# Patient Record
Sex: Female | Born: 1995 | Race: White | Hispanic: No | Marital: Single | Smoking: Never smoker
Health system: Southern US, Community
[De-identification: ages and names within clinical notes are randomized; demographics above are authoritative.]

## PROBLEM LIST (undated history)

## (undated) HISTORY — PX: EYE SURGERY: SHX253

---
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
## 2017-07-12 ENCOUNTER — Encounter (HOSPITAL_COMMUNITY): Payer: Self-pay | Admitting: Family Medicine

## 2017-07-12 ENCOUNTER — Ambulatory Visit (HOSPITAL_COMMUNITY)
Admission: EM | Admit: 2017-07-12 | Discharge: 2017-07-12 | Disposition: A | Discharge status: Home / Self Care | Payer: BLUE CROSS/BLUE SHIELD | Attending: Family Medicine | Admitting: Family Medicine

## 2017-07-12 DIAGNOSIS — R1013 Epigastric pain: Secondary | ICD-10-CM | POA: Diagnosis not present

## 2017-07-12 NOTE — ED Triage Notes (Signed)
Pt here for epigastric pain that has been going on for a month. Reports she believed she was pregnancy but she had a negative pregnancy test. sts she missed a period. Reports nausea and fatigue with eating,.

## 2017-07-12 NOTE — Discharge Instructions (Signed)
You may take Zantac 150mg  twice daily.

## 2017-07-12 NOTE — ED Notes (Signed)
Patient discharged by provider Brian Hagler, MD  

## 2017-07-13 NOTE — ED Provider Notes (Signed)
  [facility]   [id number] 07/12/17 Arrival Time: 1805  ASSESSMENT & PLAN:  1. Dyspepsia    Will begin OTC Zantac 150mg  BID. Smaller and more frequent meals. Will f/u with PCP or here if needed. Reviewed expectations re: course of current medical issues. Questions answered. Outlined signs and symptoms indicating need for more acute intervention. Patient verbalized understanding. After Visit Summary given.   SUBJECTIVE:  Chelsea Green is a 21 y.o. female who presents with complaint of abdominal discomfort. Onset gradual, 1 month ago. Off and on. Worse after meals. "Burning sensation" described in epigastric area without radiation. Occasional nausea without emesis. Normal PO intake. No urinary symptoms. No flank pain.   OTC treatment: None.  Patient's last menstrual period was 07/05/2017.   Past Surgical History:  Procedure Laterality Date  . EYE SURGERY      ROS: As per HPI.  OBJECTIVE: General appearance: alert; no distress Lungs: clear to auscultation bilaterally Heart: regular rate and rhythm Abdomen: soft; non-distended; no tenderness; bowel sounds present; no masses or organomegaly; no guarding or rebound tenderness Back: no CVA tenderness Extremities: no edema; symmetrical with no gross deformities Skin: warm and dry Neurologic: normal gait Psychological: alert and cooperative; normal mood and affect  No Known Allergies                                              Social History   Social History  . Marital status: Single    Spouse name: N/A  . Number of children: N/A  . Years of education: N/A   Occupational History  . Not on file.   Social History Main Topics  . Smoking status: Never Smoker  . Smokeless tobacco: Not on file  . Alcohol use Not on file  . Drug use: Unknown  . Sexual activity: Not on file   Other Topics Concern  . Not on file   Social History Narrative  . No narrative on file      Mardella LaymanHagler, Jeray Shugart, MD 07/13/17 1015

## 2018-06-07 ENCOUNTER — Other Ambulatory Visit: Payer: Self-pay

## 2018-06-07 ENCOUNTER — Emergency Department (HOSPITAL_COMMUNITY)
Admission: EM | Admit: 2018-06-07 | Discharge: 2018-06-08 | Disposition: A | Discharge status: Home / Self Care | Payer: BLUE CROSS/BLUE SHIELD | Attending: Emergency Medicine | Admitting: Emergency Medicine

## 2018-06-07 ENCOUNTER — Emergency Department (HOSPITAL_COMMUNITY): Payer: BLUE CROSS/BLUE SHIELD

## 2018-06-07 ENCOUNTER — Encounter (HOSPITAL_COMMUNITY): Payer: Self-pay | Admitting: Emergency Medicine

## 2018-06-07 DIAGNOSIS — Y998 Other external cause status: Secondary | ICD-10-CM | POA: Diagnosis not present

## 2018-06-07 DIAGNOSIS — Y92009 Unspecified place in unspecified non-institutional (private) residence as the place of occurrence of the external cause: Secondary | ICD-10-CM | POA: Insufficient documentation

## 2018-06-07 DIAGNOSIS — Y9389 Activity, other specified: Secondary | ICD-10-CM | POA: Insufficient documentation

## 2018-06-07 DIAGNOSIS — W01198A Fall on same level from slipping, tripping and stumbling with subsequent striking against other object, initial encounter: Secondary | ICD-10-CM | POA: Insufficient documentation

## 2018-06-07 DIAGNOSIS — S7002XA Contusion of left hip, initial encounter: Secondary | ICD-10-CM | POA: Diagnosis not present

## 2018-06-07 DIAGNOSIS — W010XXA Fall on same level from slipping, tripping and stumbling without subsequent striking against object, initial encounter: Secondary | ICD-10-CM

## 2018-06-07 DIAGNOSIS — S79912A Unspecified injury of left hip, initial encounter: Secondary | ICD-10-CM | POA: Diagnosis present

## 2018-06-07 LAB — I-STAT BETA HCG BLOOD, ED (MC, WL, AP ONLY)

## 2018-06-07 MED ORDER — OXYCODONE-ACETAMINOPHEN 5-325 MG PO TABS
1.0000 | ORAL_TABLET | ORAL | Status: DC | PRN
  Administered 2018-06-07: 1 via ORAL
  Filled 2018-06-07: qty 1

## 2018-06-07 NOTE — ED Triage Notes (Signed)
Pt reports she slipped on a rug and landed on her L hip. Pt reports she has had issues with her L hip being dislocated in the past. Pt reports the pain radiates to her back. Pt reports the pain worsens with movement.

## 2018-06-08 MED ORDER — TRAMADOL HCL 50 MG PO TABS
50.0000 mg | ORAL_TABLET | Freq: Once | ORAL | Status: AC
  Administered 2018-06-08: 50 mg via ORAL
  Filled 2018-06-08: qty 1

## 2018-06-08 MED ORDER — TRAMADOL HCL 50 MG PO TABS: 50.0000 mg | ORAL_TABLET | Freq: Four times a day (QID) | ORAL | 0 refills | Status: AC | PRN

## 2018-06-08 NOTE — ED Provider Notes (Signed)
[facility] EMERGENCY DEPARTMENT Provider Note   CSN: [id number] Arrival date & time: 06/07/18  2130     History   Chief Complaint Chief Complaint  Patient presents with  . Hip Pain    HPI Chelsea Green is a 22 y.o. female.  The history is provided by the patient.  She had a slip and fall at home landing on her left hip.  She is complaining of pain in that hip.  Pain is rated at 9/10.  She has been having pain in that hip for quite some time.  She relates that she was seen at a clinic where x-rays showed that she had probably dislocated her hip and it spontaneously popped back in place.  She has been taking naproxen at home for this ongoing pain.  Tonight, after the fall, she was able to put some weight on the left leg, but it was quite painful.  She denies other injury.  History reviewed. No pertinent past medical history.  There are no active problems to display for this patient.   Past Surgical History:  Procedure Laterality Date  . EYE SURGERY       OB History   None      Home Medications    Prior to Admission medications   Not on File    Family History No family history on file.  Social History Social History   Tobacco Use  . Smoking status: Never Smoker  . Smokeless tobacco: Never Used  Substance Use Topics  . Alcohol use: Never    Frequency: Never  . Drug use: Not Currently     Allergies   Patient has no known allergies.   Review of Systems Review of Systems  All other systems reviewed and are negative.    Physical Exam Updated Vital Signs BP 109/70 (BP Location: Right Arm)   Pulse 87   Temp 98 F (36.7 C) (Oral)   Resp 14   LMP 05/28/2018   SpO2 100%   Physical Exam  Nursing note and vitals reviewed.  22 year old female, resting comfortably and in no acute distress. Vital signs are normal. Oxygen saturation is 100%, which is normal. Head is normocephalic and atraumatic. PERRLA, EOMI. Oropharynx is  clear. Neck is nontender and supple without adenopathy or JVD. Back is nontender and there is no CVA tenderness. Lungs are clear without rales, wheezes, or rhonchi. Chest is nontender. Heart has regular rate and rhythm without murmur. Abdomen is soft, flat, nontender without masses or hepatosplenomegaly and peristalsis is normoactive. Extremities: Moderate tenderness in the left hip extending into the left pubic ramus and left lower lumbar area.  No deformities present.  Full passive range of motion is present, but there is pain with any movement.  Remainder of extremity exam is normal. Skin is warm and dry without rash. Neurologic: Mental status is normal, cranial nerves are intact, there are no motor or sensory deficits.  ED Treatments / Results  Labs (all labs ordered are listed, but only abnormal results are displayed) Labs Reviewed  I-STAT BETA HCG BLOOD, ED ([facility], [facility], [facility] ONLY)   Radiology Dg Hip Unilat With Pelvis 2-3 Views Left  Result Date: 06/07/2018 CLINICAL DATA:  Patient slipped on rugal landing on left hip.  Pain. EXAM: DG HIP (WITH OR WITHOUT PELVIS) 2-3V LEFT COMPARISON:  None. FINDINGS: There is no evidence of hip fracture or dislocation. No pelvic diastasis. The pubic rami appear intact. There is no evidence of arthropathy or other  focal bone abnormality. Soft tissues are unremarkable. IMPRESSION: No fracture or malalignment identified. Electronically Signed   By: Tollie Ethavid  Kwon M.D.   On: 06/07/2018 22:56    Procedures Procedures   Medications Ordered in ED Medications  oxyCODONE-acetaminophen (PERCOCET/ROXICET) 5-325 MG per tablet 1 tablet (1 tablet Oral Given 06/07/18 2201)  traMADol (ULTRAM) tablet 50 mg (has no administration in time range)     Initial Impression / Assessment and Plan / ED Course  I have reviewed the triage vital signs and the nursing notes.  Pertinent labs & imaging results that were available during my care of the patient were reviewed by me and  considered in my medical decision making (see chart for details).  Fall with injury to the left hip.  X-rays are negative for fracture.  I am curious about her history of spontaneous dislocation and reduction of left hip.  This does not seem likely.  I do wonder if her chronic hip pain may actually be sciatica.  She is discharged with instructions on ice and elevation.  Given crutches to use as needed.  Told to use over-the-counter analgesics as needed for pain, given prescription for tramadol for more severe pain.  Referred to orthopedics for further outpatient work-up.  Final Clinical Impressions(s) / ED Diagnoses   Final diagnoses:  Fall from slipping, initial encounter  Contusion of left hip, initial encounter    ED Discharge Orders         Ordered    traMADol (ULTRAM) 50 MG tablet  Every 6 hours PRN     06/08/18 0122           Dione BoozeGlick, Jezelle Gullick, MD 06/08/18 [phone]

## 2018-06-08 NOTE — Discharge Instructions (Addendum)
Apply ice for 30 minutes 3-4 times a day.  Use crutches as needed.  Take naproxen (Aleve) as needed for pain.  You may take acetaminophen for additional pain relief.  Reserve tramadol for pain not relieved with naproxen plus acetaminophen.  Use crutches as needed.  Follow-up with the orthopedic specialist.

## 2019-04-24 ENCOUNTER — Other Ambulatory Visit: Payer: Self-pay

## 2019-04-24 ENCOUNTER — Encounter (HOSPITAL_COMMUNITY): Payer: Self-pay | Admitting: Emergency Medicine

## 2019-04-24 ENCOUNTER — Ambulatory Visit (HOSPITAL_COMMUNITY)
Admission: EM | Admit: 2019-04-24 | Discharge: 2019-04-24 | Disposition: A | Discharge status: Home / Self Care | Payer: BC Managed Care – PPO | Attending: Emergency Medicine | Admitting: Emergency Medicine

## 2019-04-24 DIAGNOSIS — R6889 Other general symptoms and signs: Secondary | ICD-10-CM

## 2019-04-24 DIAGNOSIS — R059 Cough, unspecified: Secondary | ICD-10-CM

## 2019-04-24 DIAGNOSIS — R05 Cough: Secondary | ICD-10-CM | POA: Diagnosis not present

## 2019-04-24 DIAGNOSIS — Z20828 Contact with and (suspected) exposure to other viral communicable diseases: Secondary | ICD-10-CM | POA: Diagnosis not present

## 2019-04-24 DIAGNOSIS — Z20822 Contact with and (suspected) exposure to covid-19: Secondary | ICD-10-CM

## 2019-04-24 DIAGNOSIS — R300 Dysuria: Secondary | ICD-10-CM | POA: Insufficient documentation

## 2019-04-24 LAB — POCT URINALYSIS DIP (DEVICE)
Bilirubin Urine: NEGATIVE
Glucose, UA: NEGATIVE mg/dL
Hgb urine dipstick: NEGATIVE
Ketones, ur: NEGATIVE mg/dL
Leukocytes,Ua: NEGATIVE
Nitrite: NEGATIVE
Protein, ur: NEGATIVE mg/dL
Specific Gravity, Urine: >=1.03 (ref 1.005–1.030)
Urobilinogen, UA: 0.2 mg/dL (ref 0.0–1.0)
pH: 5.5 (ref 5.0–8.0)

## 2019-04-24 NOTE — Discharge Instructions (Signed)
Your urine did not show signs of infection today.    Your COVID test is pending.  You should self quarantine until your test result is back and is negative.    Go to the emergency department if you develop shortness of breath, high fever, severe diarrhea, or other concerning symptoms.

## 2019-04-24 NOTE — ED Triage Notes (Signed)
Pt here for cough and body aches starting today

## 2019-04-24 NOTE — ED Provider Notes (Addendum)
[facility]    CSN: [id number] Arrival date & time: 04/24/19  1659     History   Chief Complaint Chief Complaint  Patient presents with  . Cough    HPI Chelsea Green is a 23 y.o. female.   Patient presents with nonproductive cough, body aches, feeling like she is running a fever, and headache; her symptoms began today.  Patient also reports a 2-day history of dysuria.  She denies shortness of breath, vomiting, diarrhea, rash, or other symptoms.  Patient states she took a Goody powder at home for her symptoms with minimal relief.  LMP: 1 week.   The history is provided by the patient.    History reviewed. No pertinent past medical history.  There are no active problems to display for this patient.   Past Surgical History:  Procedure Laterality Date  . EYE SURGERY      OB History   No obstetric history on file.      Home Medications    Prior to Admission medications   Medication Sig Start Date End Date Taking? Authorizing Provider  traMADol (ULTRAM) 50 MG tablet Take 1 tablet (50 mg total) by mouth every 6 (six) hours as needed. Patient not taking: Reported on 04/24/2019 06/08/18   Dione BoozeGlick, David, MD    Family History History reviewed. No pertinent family history.  Social History Social History   Tobacco Use  . Smoking status: Never Smoker  . Smokeless tobacco: Never Used  Substance Use Topics  . Alcohol use: Never    Frequency: Never  . Drug use: Not Currently     Allergies   Patient has no known allergies.   Review of Systems Review of Systems  Constitutional: Positive for fever. Negative for chills.  HENT: Negative for congestion, ear pain, sinus pressure and sore throat.   Eyes: Negative for pain and visual disturbance.  Respiratory: Positive for cough. Negative for shortness of breath.   Cardiovascular: Negative for chest pain and palpitations.  Gastrointestinal: Negative for abdominal pain, diarrhea and vomiting.  Genitourinary:  Positive for dysuria. Negative for hematuria and vaginal discharge.  Musculoskeletal: Negative for arthralgias and back pain.  Skin: Negative for color change and rash.  Neurological: Positive for headaches. Negative for seizures and syncope.  All other systems reviewed and are negative.    Physical Exam Triage Vital Signs ED Triage Vitals  Enc Vitals Group     BP      Pulse      Resp      Temp      Temp src      SpO2      Weight      Height      Head Circumference      Peak Flow      Pain Score      Pain Loc      Pain Edu?      Excl. in GC?    No data found.  Updated Vital Signs BP 126/66 (BP Location: Right Arm)   Pulse 92   Temp 98.5 F (36.9 C) (Temporal)   Resp 18   SpO2 100%   Visual Acuity Right Eye Distance:   Left Eye Distance:   Bilateral Distance:    Right Eye Near:   Left Eye Near:    Bilateral Near:     Physical Exam Vitals signs and nursing note reviewed.  Constitutional:      General: She is not in acute distress.    Appearance:  She is well-developed.  HENT:     Head: Normocephalic and atraumatic.     Right Ear: Tympanic membrane normal.     Left Ear: Tympanic membrane normal.     Nose: Nose normal.     Mouth/Throat:     Mouth: Mucous membranes are moist.     Pharynx: Oropharynx is clear.  Eyes:     Conjunctiva/sclera: Conjunctivae normal.  Neck:     Musculoskeletal: Neck supple.  Cardiovascular:     Rate and Rhythm: Normal rate and regular rhythm.     Heart sounds: Normal heart sounds. No murmur.  Pulmonary:     Effort: Pulmonary effort is normal. No respiratory distress.     Breath sounds: Normal breath sounds.  Abdominal:     General: Bowel sounds are normal.     Palpations: Abdomen is soft.     Tenderness: There is no abdominal tenderness. There is no right CVA tenderness, left CVA tenderness, guarding or rebound.  Skin:    General: Skin is warm and dry.     Findings: No rash.  Neurological:     Mental Status: She is  alert.      UC Treatments / Results  Labs (all labs ordered are listed, but only abnormal results are displayed) Labs Reviewed  NOVEL CORONAVIRUS, NAA (HOSPITAL ORDER, SEND-OUT TO REF LAB)  POCT URINALYSIS DIP (DEVICE)    EKG   Radiology No results found.  Procedures Procedures (including critical care time)  Medications Ordered in UC Medications - No data to display  Initial Impression / Assessment and Plan / UC Course  I have reviewed the triage vital signs and the nursing notes.  Pertinent labs & imaging results that were available during my care of the patient were reviewed by me and considered in my medical decision making (see chart for details).   Dysuria, Cough, Suspect COVID.  Urine dip negative.  COVID test performed here.  Instructed patient to self quarantine until her COVID test result is back.  Discussed with patient that she can take Tylenol or ibuprofen as needed for fever or discomfort.  Discussed with patient that she should go to the emergency department if she develops shortness of breath, high fever, severe diarrhea, or other concerning symptoms.     Final Clinical Impressions(s) / UC Diagnoses   Final diagnoses:  Dysuria  Cough  Suspected Covid-19 Virus Infection     Discharge Instructions     Your urine did not show signs of infection today.    Your COVID test is pending.  You should self quarantine until your test result is back and is negative.    Go to the emergency department if you develop shortness of breath, high fever, severe diarrhea, or other concerning symptoms.         ED Prescriptions    None     Controlled Substance Prescriptions [place] Controlled Substance Registry consulted? Not Applicable   Sharion Balloon, NP 04/24/19 1751    Sharion Balloon, NP 04/24/19 [phone]

## 2019-04-28 LAB — NOVEL CORONAVIRUS, NAA (HOSP ORDER, SEND-OUT TO REF LAB; TAT 18-24 HRS): SARS-CoV-2, NAA: NOT DETECTED

## 2020-05-17 IMAGING — DX DG HIP (WITH OR WITHOUT PELVIS) 2-3V*L*
3 series · 3 of 3 positions shown · non-contrast
Comparison: None.

CLINICAL DATA: Patient slipped on rugal landing on left hip.  Pain.

EXAM:
DG HIP (WITH OR WITHOUT PELVIS) 2-3V LEFT

[pelvis ap]
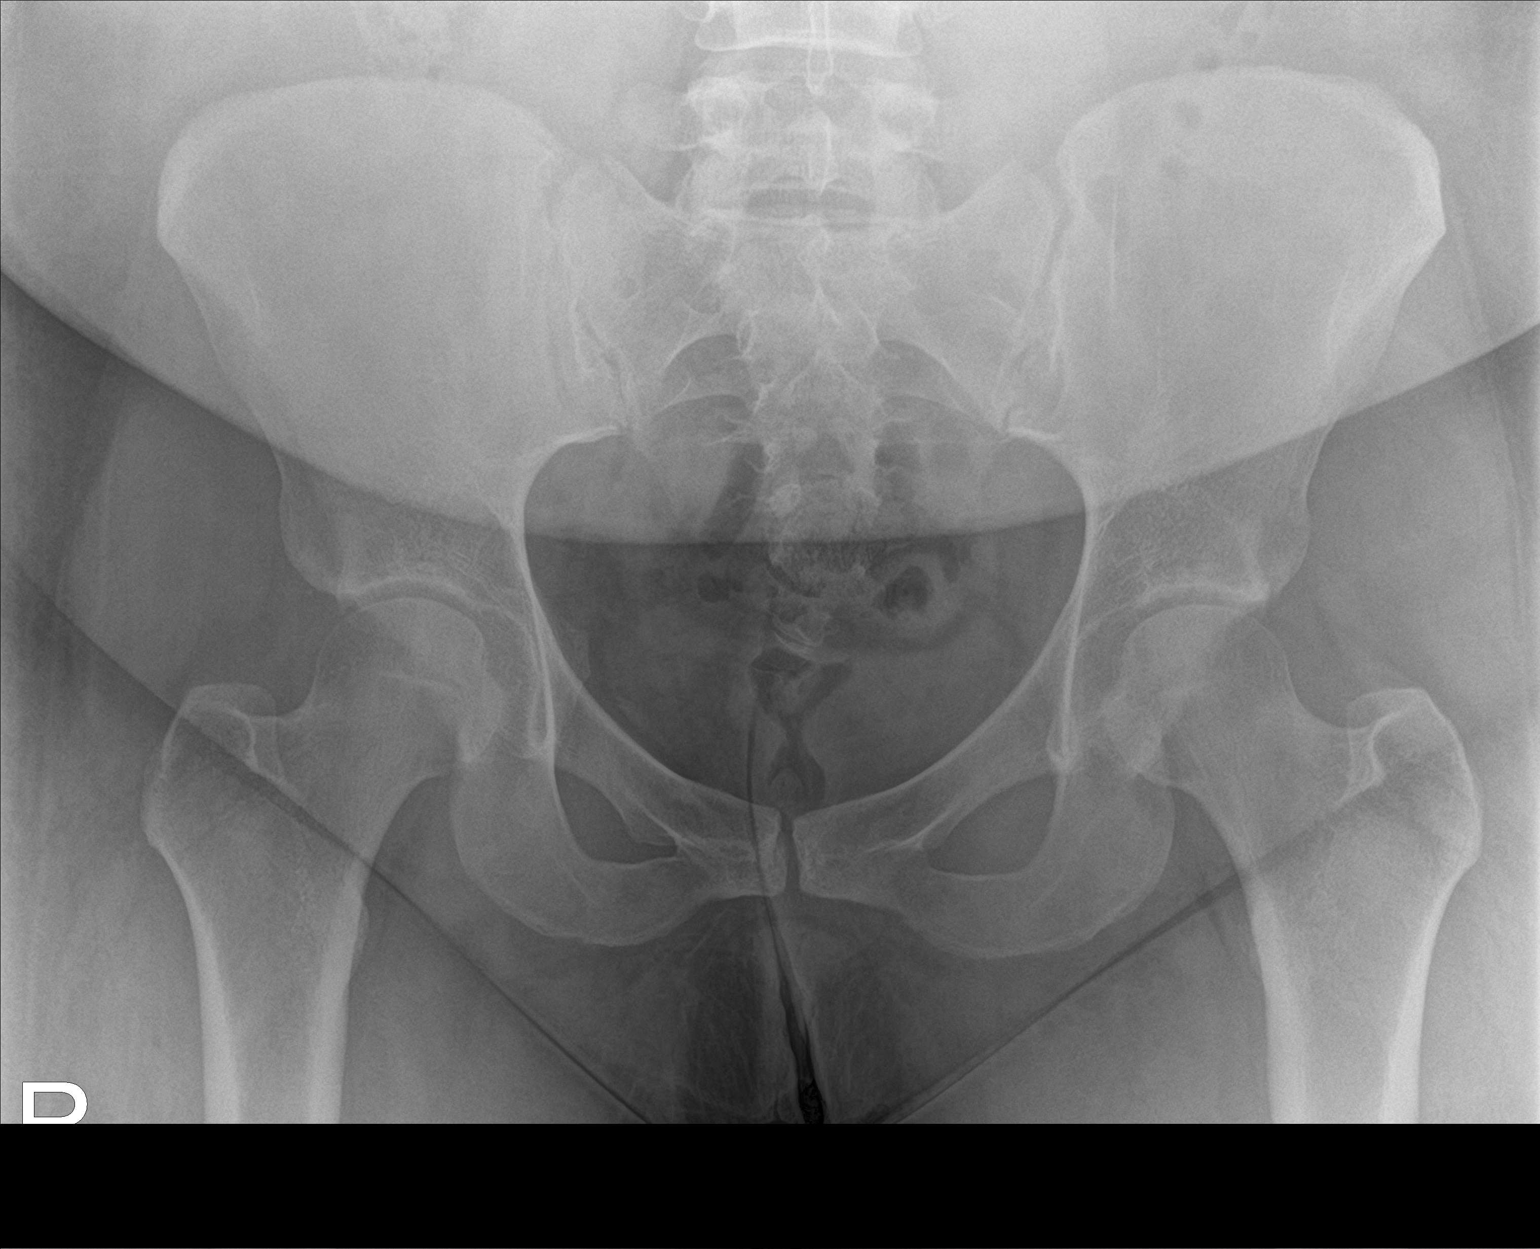

[hip ap]
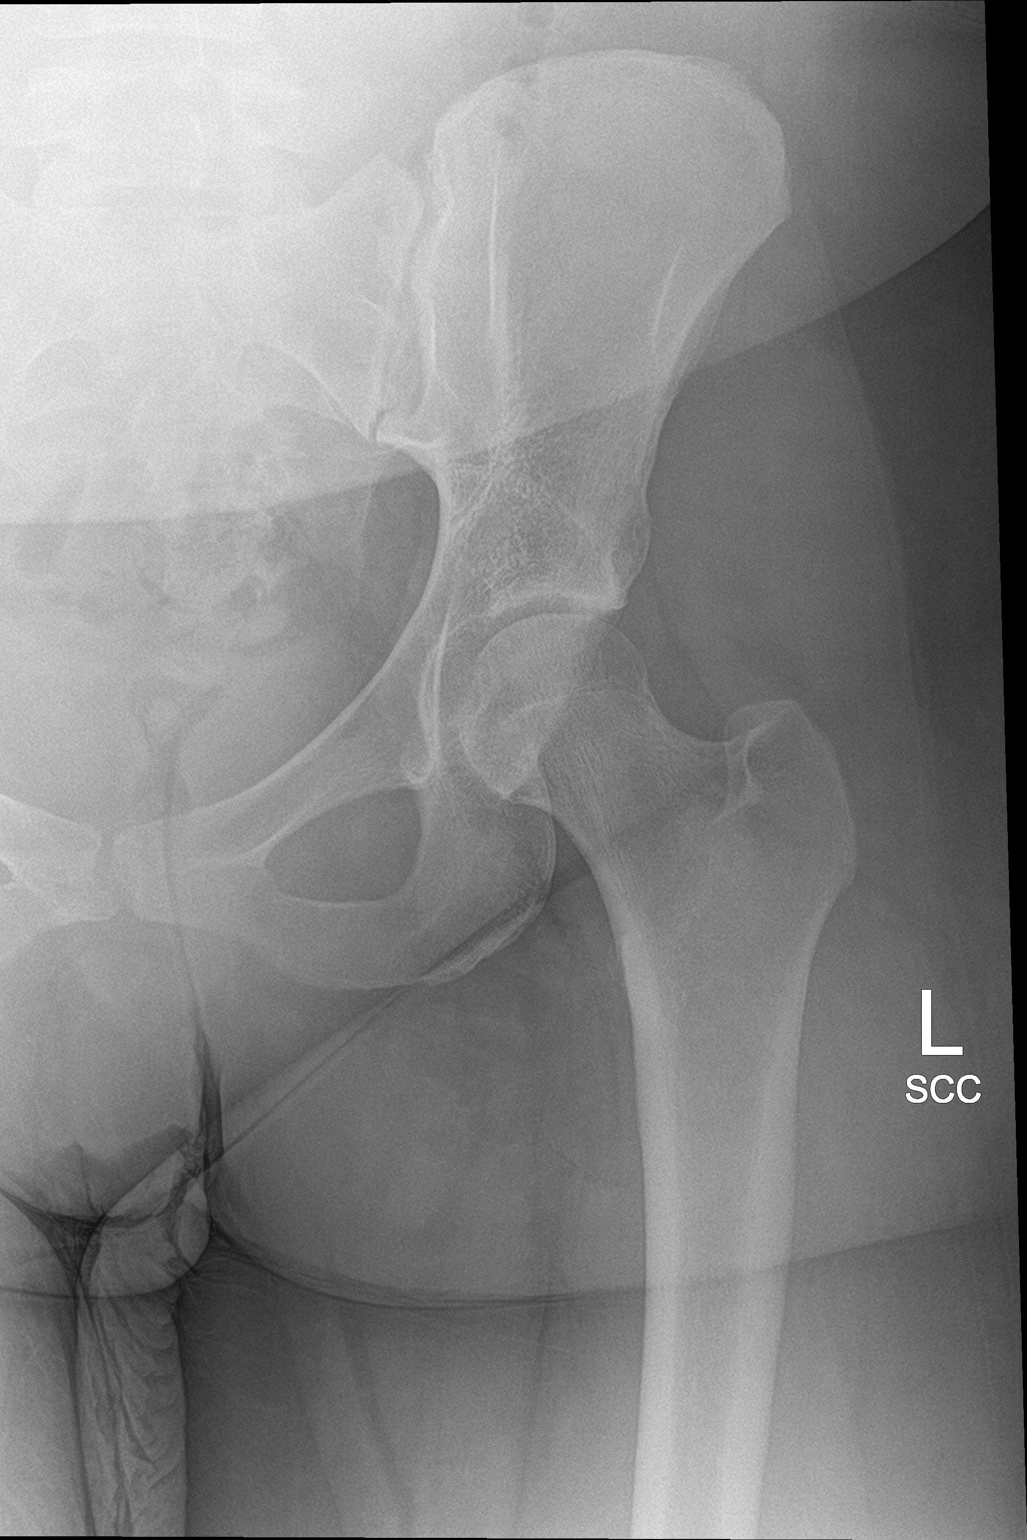

[hip lat]
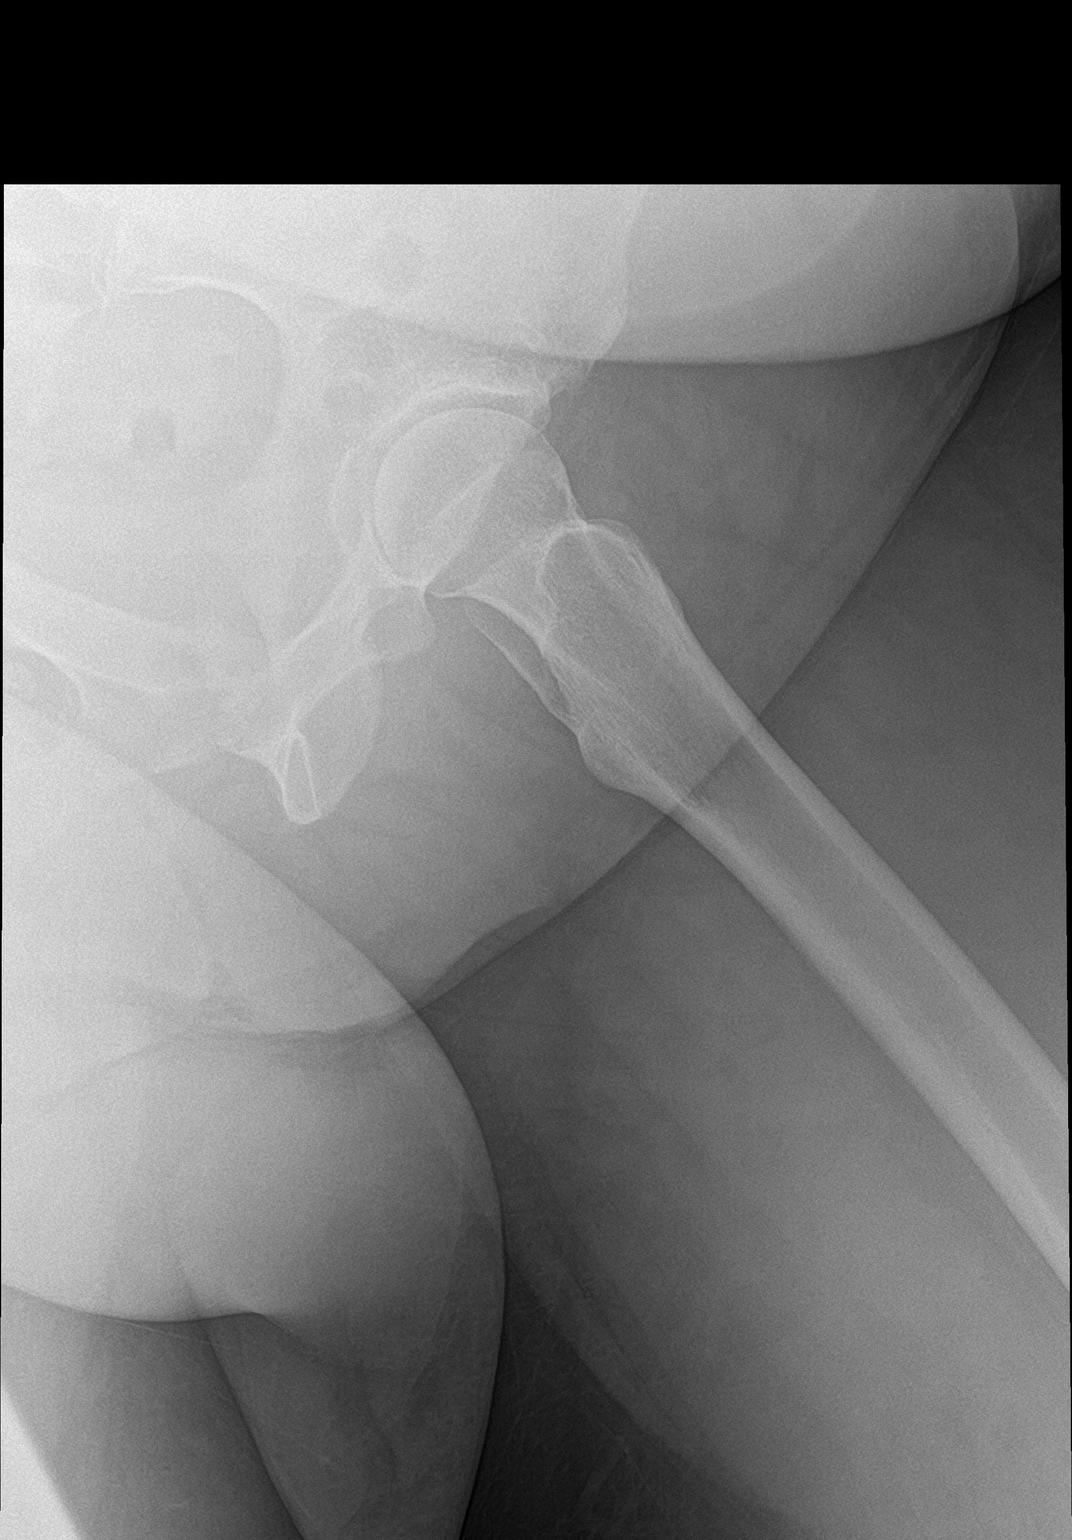

[3 of 3 positions shown; findings below may reference images not displayed]

FINDINGS: There is no evidence of hip fracture or dislocation. No pelvic
diastasis. The pubic rami appear intact. There is no evidence of
arthropathy or other focal bone abnormality. Soft tissues are
unremarkable.
IMPRESSION: No fracture or malalignment identified.

## 2020-10-12 ENCOUNTER — Ambulatory Visit (HOSPITAL_COMMUNITY)
Admission: EM | Admit: 2020-10-12 | Discharge: 2020-10-12 | Disposition: A | Discharge status: Home / Self Care | Payer: HRSA Program | Attending: Family Medicine | Admitting: Family Medicine

## 2020-10-12 ENCOUNTER — Encounter (HOSPITAL_COMMUNITY): Payer: Self-pay

## 2020-10-12 ENCOUNTER — Other Ambulatory Visit: Payer: Self-pay

## 2020-10-12 DIAGNOSIS — R059 Cough, unspecified: Secondary | ICD-10-CM | POA: Insufficient documentation

## 2020-10-12 DIAGNOSIS — J029 Acute pharyngitis, unspecified: Secondary | ICD-10-CM | POA: Insufficient documentation

## 2020-10-12 DIAGNOSIS — R519 Headache, unspecified: Secondary | ICD-10-CM | POA: Insufficient documentation

## 2020-10-12 DIAGNOSIS — J069 Acute upper respiratory infection, unspecified: Secondary | ICD-10-CM

## 2020-10-12 DIAGNOSIS — U071 COVID-19: Secondary | ICD-10-CM | POA: Insufficient documentation

## 2020-10-12 LAB — SARS CORONAVIRUS 2 (TAT 6-24 HRS): SARS Coronavirus 2: POSITIVE — AB

## 2020-10-12 MED ORDER — PROMETHAZINE-DM 6.25-15 MG/5ML PO SYRP: 5.0000 mL | ORAL_SOLUTION | Freq: Four times a day (QID) | ORAL | 0 refills | Status: AC | PRN

## 2020-10-12 NOTE — ED Triage Notes (Signed)
Patient states she has had a sore throat and cough since Tuesday and had an exposure. Pt is aox4 and ambulatory.

## 2020-10-12 NOTE — ED Provider Notes (Signed)
[facility]    CSN: [id number] Arrival date & time: 10/12/20  1157      History   Chief Complaint Chief Complaint  Patient presents with  . Cough    Since tuesday  . Sore Throat    Since tuesday    HPI Chelsea Green is a 25 y.o. female.   Here today with 6 day history of dry cough, mild sore throat, congestion, headache. Main symptom lingering now is a dry cough. Denies wheezing, CP, SOB, fever, body aches, chills. Taking mucinex, cough drops, OTC cough syrups with some relief. Does smoke, no known hx of asthma or COPD. Sister sick with similar sxs. UTD on COVID vaccines.      History reviewed. No pertinent past medical history.  There are no problems to display for this patient.   Past Surgical History:  Procedure Laterality Date  . EYE SURGERY      OB History   No obstetric history on file.      Home Medications    Prior to Admission medications   Medication Sig Start Date End Date Taking? Authorizing Provider  promethazine-dextromethorphan (PROMETHAZINE-DM) 6.25-15 MG/5ML syrup Take 5 mLs by mouth 4 (four) times daily as needed for cough. 10/12/20  Yes Particia Nearing, PA-C  traMADol (ULTRAM) 50 MG tablet Take 1 tablet (50 mg total) by mouth every 6 (six) hours as needed. Patient not taking: No sig reported 06/08/18   Dione Booze, MD    Family History History reviewed. No pertinent family history.  Social History Social History   Tobacco Use  . Smoking status: Never Smoker  . Smokeless tobacco: Never Used  Vaping Use  . Vaping Use: Never used  Substance Use Topics  . Alcohol use: Never  . Drug use: Not Currently     Allergies   Patient has no known allergies.   Review of Systems Review of Systems PER HPI    Physical Exam Triage Vital Signs ED Triage Vitals  Enc Vitals Group     BP 10/12/20 1323 119/87     Pulse Rate 10/12/20 1323 86     Resp 10/12/20 1323 17     Temp 10/12/20 1323 98.2 F (36.8 C)     Temp  Source 10/12/20 1323 Oral     SpO2 10/12/20 1323 100 %     Weight --      Height --      Head Circumference --      Peak Flow --      Pain Score 10/12/20 1325 3     Pain Loc --      Pain Edu? --      Excl. in GC? --    No data found.  Updated Vital Signs BP 119/87 (BP Location: Right Arm)   Pulse 86   Temp 98.2 F (36.8 C) (Oral)   Resp 17   LMP 09/28/2020 (Approximate)   SpO2 100%   Visual Acuity Right Eye Distance:   Left Eye Distance:   Bilateral Distance:    Right Eye Near:   Left Eye Near:    Bilateral Near:     Physical Exam Vitals and nursing note reviewed.  Constitutional:      Appearance: Normal appearance. She is not ill-appearing.  HENT:     Head: Atraumatic.     Right Ear: Tympanic membrane normal.     Left Ear: Tympanic membrane normal.     Nose: Nose normal.     Mouth/Throat:  Mouth: Mucous membranes are moist.     Pharynx: Posterior oropharyngeal erythema present. No oropharyngeal exudate.  Eyes:     Extraocular Movements: Extraocular movements intact.     Conjunctiva/sclera: Conjunctivae normal.  Cardiovascular:     Rate and Rhythm: Normal rate and regular rhythm.     Heart sounds: Normal heart sounds.  Pulmonary:     Effort: Pulmonary effort is normal. No respiratory distress.     Breath sounds: Normal breath sounds. No wheezing or rales.  Abdominal:     General: Bowel sounds are normal. There is no distension.     Palpations: Abdomen is soft.     Tenderness: There is no abdominal tenderness. There is no guarding.  Musculoskeletal:        General: Normal range of motion.     Cervical back: Normal range of motion and neck supple.  Skin:    General: Skin is warm and dry.  Neurological:     Mental Status: She is alert and oriented to person, place, and time.  Psychiatric:        Mood and Affect: Mood normal.        Thought Content: Thought content normal.        Judgment: Judgment normal.      UC Treatments / Results  Labs (all  labs ordered are listed, but only abnormal results are displayed) Labs Reviewed  SARS CORONAVIRUS 2 (TAT 6-24 HRS)    EKG   Radiology No results found.  Procedures Procedures (including critical care time)  Medications Ordered in UC Medications - No data to display  Initial Impression / Assessment and Plan / UC Course  I have reviewed the triage vital signs and the nursing notes.  Pertinent labs & imaging results that were available during my care of the patient were reviewed by me and considered in my medical decision making (see chart for details).     Exam and vitals reassuring, sxs greatly improving. Phenergan DM for lingering cough, COVID pcr pending, work note given. Supportive care and return precautions reviewed.   Final Clinical Impressions(s) / UC Diagnoses   Final diagnoses:  Viral URI with cough   Discharge Instructions   None    ED Prescriptions    Medication Sig Dispense Auth. Provider   promethazine-dextromethorphan (PROMETHAZINE-DM) 6.25-15 MG/5ML syrup Take 5 mLs by mouth 4 (four) times daily as needed for cough. 100 mL Particia Nearing, [place]     PDMP not reviewed this encounter.   Particia Nearing, [place] 10/12/20 1400
